# Patient Record
Sex: Female | Born: 1990 | Hispanic: No | Marital: Single | Smoking: Never smoker
Health system: Southern US, Community
[De-identification: ages and names within clinical notes are randomized; demographics above are authoritative.]

## PROBLEM LIST (undated history)

## (undated) DIAGNOSIS — F419 Anxiety disorder, unspecified: Secondary | ICD-10-CM

## (undated) DIAGNOSIS — Z789 Other specified health status: Secondary | ICD-10-CM

## (undated) HISTORY — PX: TYMPANOSTOMY TUBE PLACEMENT: SHX32

## (undated) HISTORY — DX: Other specified health status: Z78.9

---

## 2009-02-01 ENCOUNTER — Emergency Department (HOSPITAL_COMMUNITY): Admission: EM | Admit: 2009-02-01 | Discharge: 2009-02-01 | Payer: Self-pay | Admitting: Emergency Medicine

## 2009-10-09 IMAGING — CR DG CHEST 2V
2 series · 2 of 2 positions shown · non-contrast
Comparison: None

CLINICAL DATA: Coughing congestion

CHEST - 2 VIEW

[view not recorded (1 of 2)]
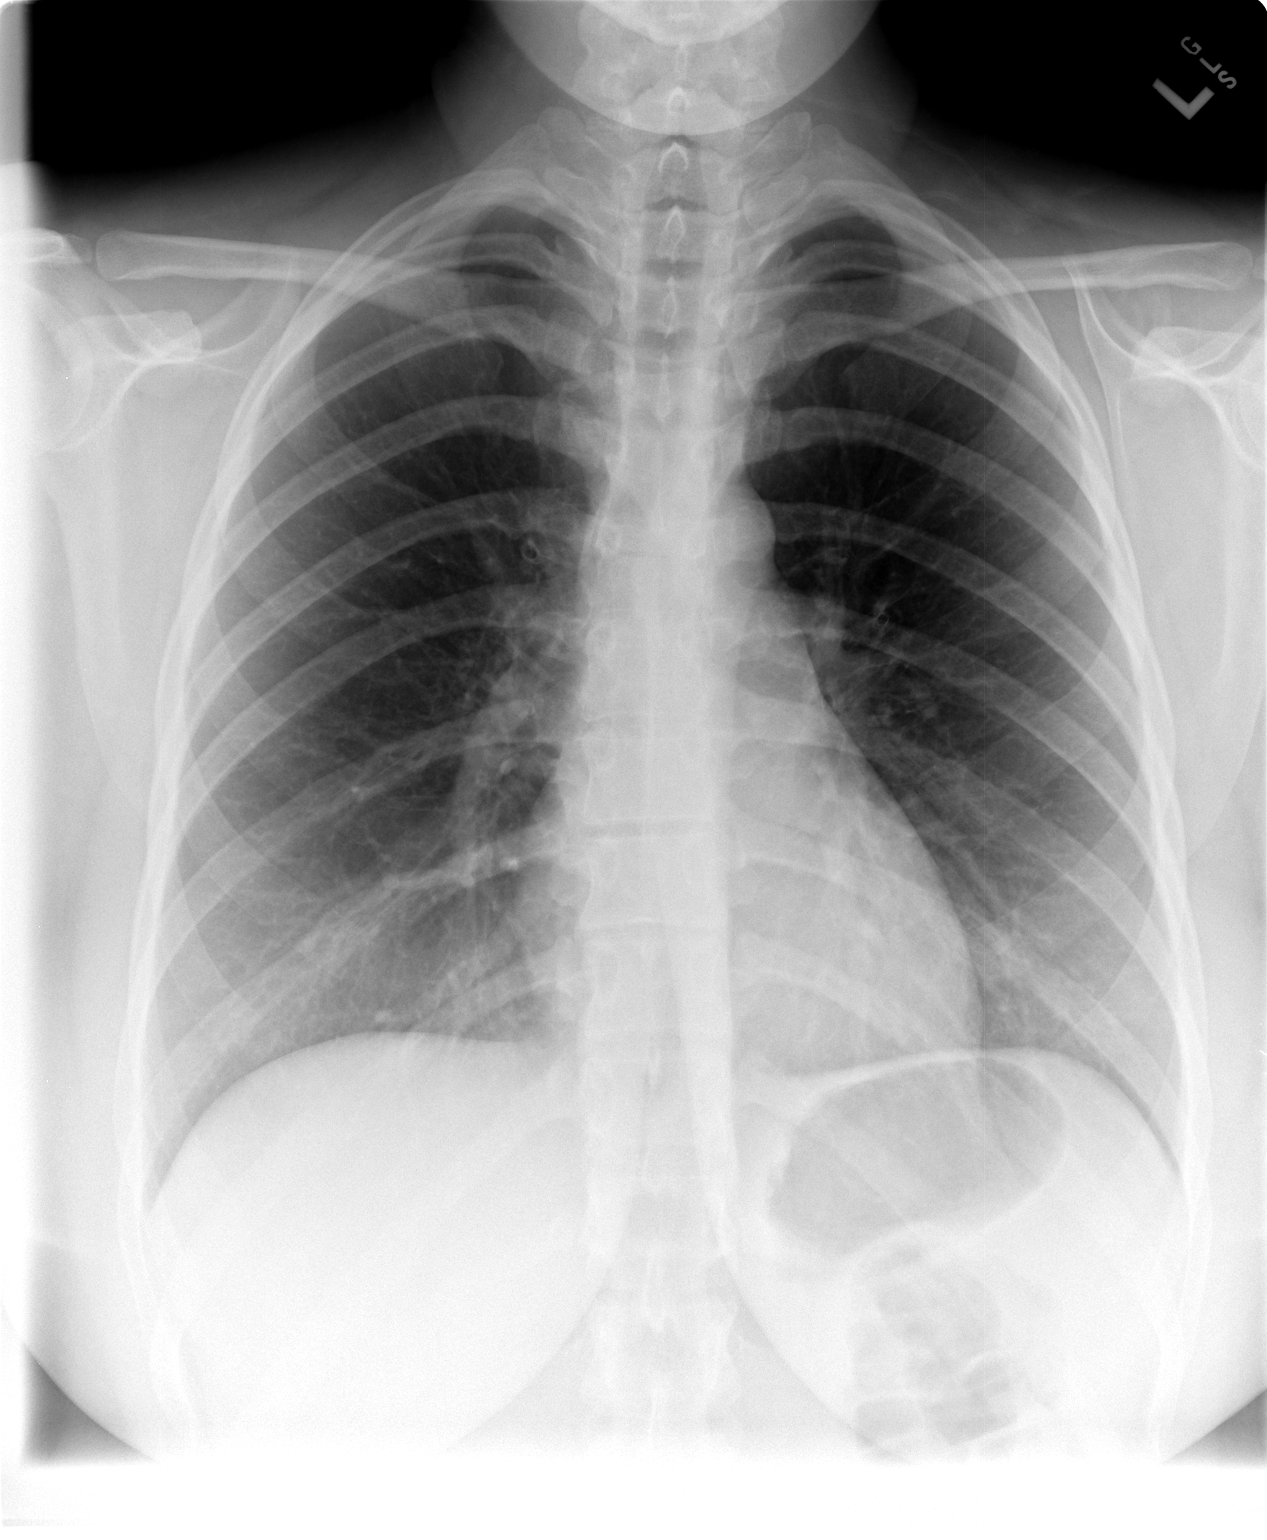

[view not recorded (2 of 2)]
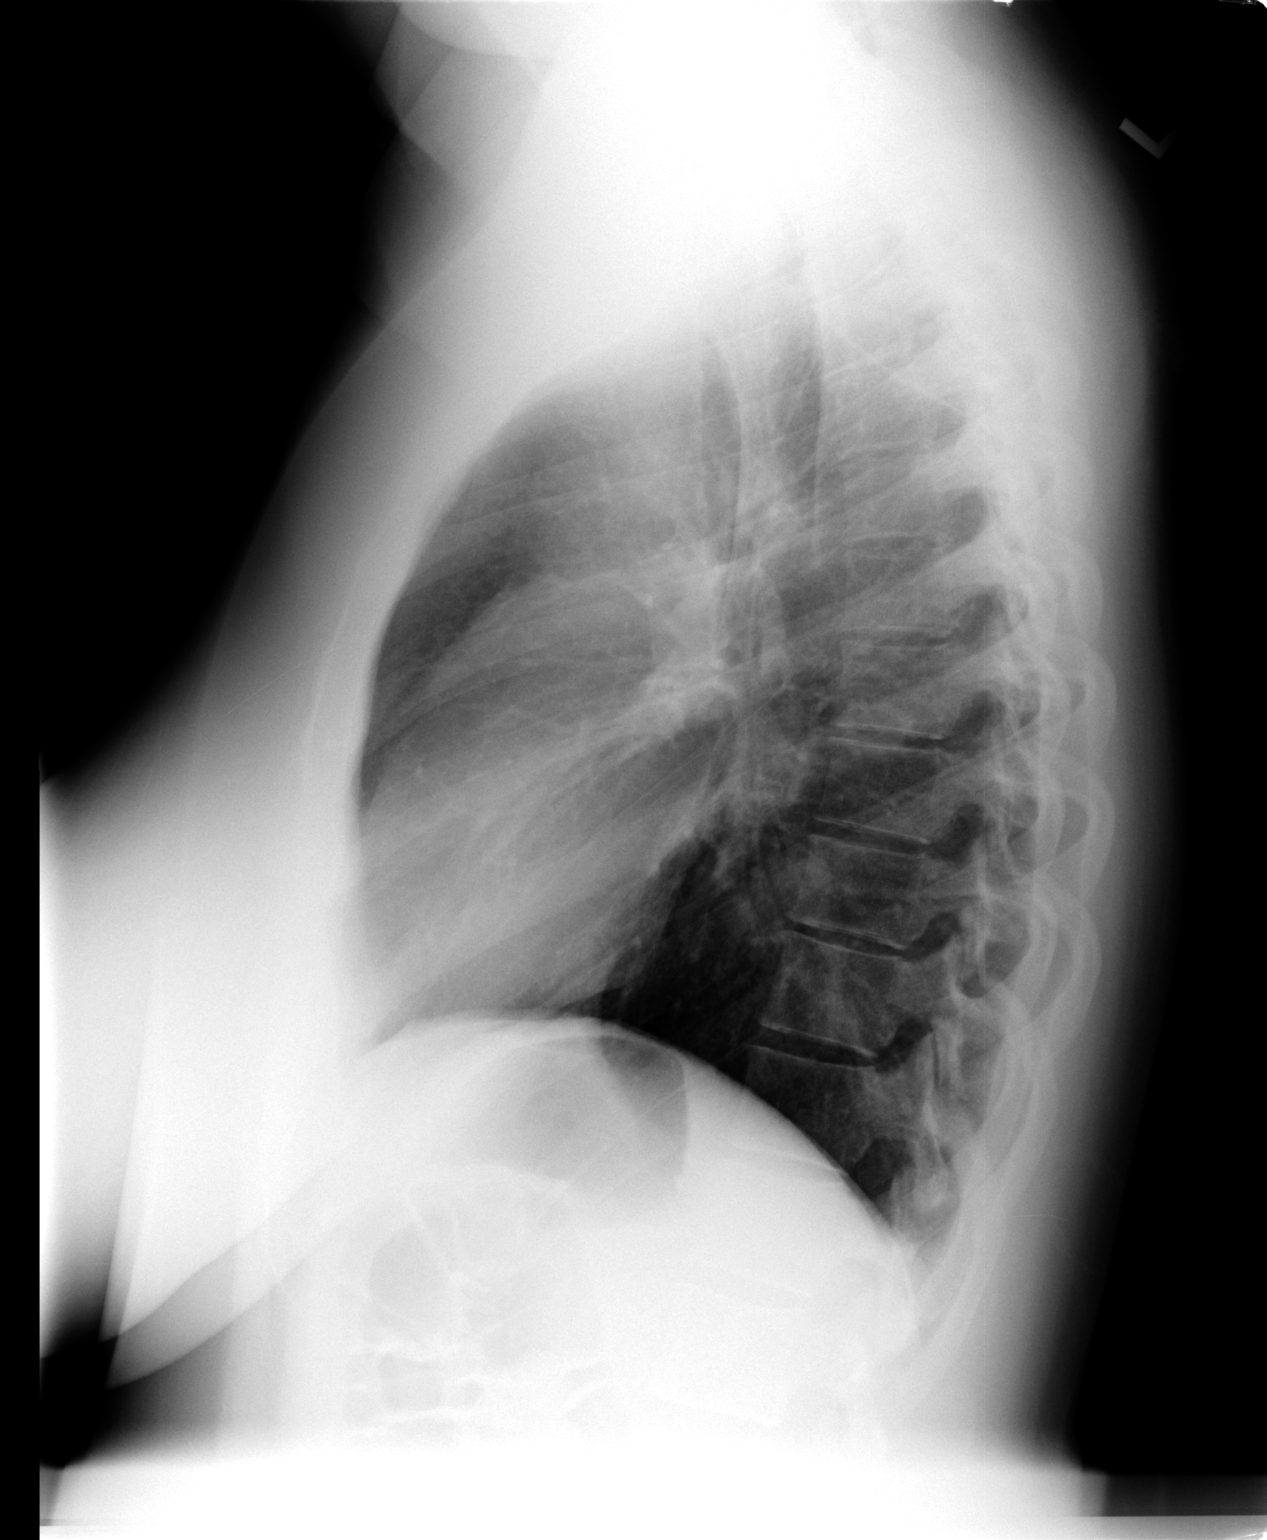

[2 of 2 positions shown; findings below may reference images not displayed]

FINDINGS: Normal mediastinum and cardiac silhouette.  Costophrenic
angles are clear.  No evidence effusion, infiltrate, or
pneumothorax.
IMPRESSION: No acute cardiopulmonary process.

## 2010-12-18 NOTE — L&D Delivery Note (Signed)
Delivery Note At 5:17 PM a healthy female was delivered via Vaginal, Spontaneous Delivery (Presentation: LIOA ).  APGAR: 8, 9; weight 8 lb 5.7 oz (3790 g).   Placenta status: Intact, Spontaneous. Moderate meconium, baby had good cry and color immediately following delivery.  Anesthesia: Epidural  Episiotomy: None Lacerations: 1st degree;Perineal Suture Repair: 3.0 vicryl rapide Est. Blood Loss (mL): 350cc  Mom to postpartum.  Baby to nursery-stable.  Oliver Pila 08/08/2011, 6:03 PM

## 2011-08-04 ENCOUNTER — Encounter (HOSPITAL_COMMUNITY): Payer: Self-pay | Admitting: *Deleted

## 2011-08-04 ENCOUNTER — Telehealth (HOSPITAL_COMMUNITY): Payer: Self-pay | Admitting: *Deleted

## 2011-08-04 NOTE — Telephone Encounter (Signed)
Preadmission screening 

## 2011-08-07 ENCOUNTER — Inpatient Hospital Stay (HOSPITAL_COMMUNITY)
Admission: RE | Admit: 2011-08-07 | Discharge: 2011-08-10 | DRG: 775 | Disposition: A | Discharge status: Home / Self Care | Payer: Medicaid Other | Source: Ambulatory Visit | Attending: Obstetrics and Gynecology | Admitting: Obstetrics and Gynecology

## 2011-08-07 ENCOUNTER — Encounter (HOSPITAL_COMMUNITY): Payer: Self-pay

## 2011-08-07 ENCOUNTER — Other Ambulatory Visit: Payer: Self-pay | Admitting: Obstetrics and Gynecology

## 2011-08-07 DIAGNOSIS — O480 Post-term pregnancy: Principal | ICD-10-CM | POA: Diagnosis present

## 2011-08-07 HISTORY — DX: Anxiety disorder, unspecified: F41.9

## 2011-08-07 LAB — CBC
HCT: 36.9 % (ref 36.0–46.0)
Hemoglobin: 12.3 g/dL (ref 12.0–15.0)
MCH: 26.4 pg (ref 26.0–34.0)
MCHC: 33.3 g/dL (ref 30.0–36.0)
MCV: 79.2 fL (ref 78.0–100.0)
Platelets: 263 10*3/uL (ref 150–400)
RBC: 4.66 MIL/uL (ref 3.87–5.11)
RDW: 15.4 % (ref 11.5–15.5)
WBC: 9.6 10*3/uL (ref 4.0–10.5)

## 2011-08-07 LAB — ABO/RH: RH Type: POSITIVE

## 2011-08-07 LAB — HEPATITIS B SURFACE ANTIGEN: Hepatitis B Surface Ag: NEGATIVE

## 2011-08-07 LAB — RPR: RPR: NONREACTIVE

## 2011-08-07 LAB — STREP B DNA PROBE: GBS: NEGATIVE

## 2011-08-07 LAB — RUBELLA ANTIBODY, IGM: Rubella: IMMUNE

## 2011-08-07 LAB — ANTIBODY SCREEN: Antibody Screen: NEGATIVE

## 2011-08-07 LAB — HIV ANTIBODY (ROUTINE TESTING W REFLEX): HIV: NONREACTIVE

## 2011-08-07 MED ORDER — OXYTOCIN BOLUS FROM INFUSION
500.0000 mL | Freq: Once | INTRAVENOUS | Status: DC
  Filled 2011-08-07: qty 500

## 2011-08-07 MED ORDER — LACTATED RINGERS IV SOLN
INTRAVENOUS | Status: DC
  Administered 2011-08-07 – 2011-08-08 (×4): via INTRAVENOUS

## 2011-08-07 MED ORDER — OXYCODONE-ACETAMINOPHEN 5-325 MG PO TABS
2.0000 | ORAL_TABLET | ORAL | Status: DC | PRN
  Filled 2011-08-07: qty 1

## 2011-08-07 MED ORDER — OXYTOCIN 20 UNITS IN LACTATED RINGERS INFUSION - SIMPLE
1.0000 m[IU]/min | INTRAVENOUS | Status: DC
  Administered 2011-08-08: 2 m[IU]/min via INTRAVENOUS
  Administered 2011-08-08: 333 m[IU]/min via INTRAVENOUS
  Filled 2011-08-07: qty 1000

## 2011-08-07 MED ORDER — ONDANSETRON HCL 4 MG/2ML IJ SOLN: 4.0000 mg | Freq: Four times a day (QID) | INTRAMUSCULAR | Status: DC | PRN

## 2011-08-07 MED ORDER — OXYTOCIN 20 UNITS IN LACTATED RINGERS INFUSION - SIMPLE: 125.0000 mL/h | INTRAVENOUS | Status: DC

## 2011-08-07 MED ORDER — ZOLPIDEM TARTRATE 10 MG PO TABS
10.0000 mg | ORAL_TABLET | Freq: Every evening | ORAL | Status: DC | PRN
  Administered 2011-08-07: 10 mg via ORAL
  Filled 2011-08-07: qty 1

## 2011-08-07 MED ORDER — IBUPROFEN 600 MG PO TABS: 600.0000 mg | ORAL_TABLET | Freq: Four times a day (QID) | ORAL | Status: DC | PRN

## 2011-08-07 MED ORDER — FLEET ENEMA 7-19 GM/118ML RE ENEM: 1.0000 | ENEMA | RECTAL | Status: DC | PRN

## 2011-08-07 MED ORDER — DINOPROSTONE 10 MG VA INST
10.0000 mg | VAGINAL_INSERT | Freq: Once | VAGINAL | Status: AC
  Administered 2011-08-07: 10 mg via VAGINAL
  Filled 2011-08-07: qty 1

## 2011-08-07 MED ORDER — LACTATED RINGERS IV SOLN: 500.0000 mL | INTRAVENOUS | Status: DC | PRN

## 2011-08-07 MED ORDER — CITRIC ACID-SODIUM CITRATE 334-500 MG/5ML PO SOLN: 30.0000 mL | ORAL | Status: DC | PRN

## 2011-08-07 MED ORDER — ACETAMINOPHEN 325 MG PO TABS: 650.0000 mg | ORAL_TABLET | ORAL | Status: DC | PRN

## 2011-08-07 MED ORDER — TERBUTALINE SULFATE 1 MG/ML IJ SOLN: 0.2500 mg | Freq: Once | INTRAMUSCULAR | Status: AC | PRN

## 2011-08-07 MED ORDER — LIDOCAINE HCL (PF) 1 % IJ SOLN
30.0000 mL | INTRAMUSCULAR | Status: DC | PRN
  Filled 2011-08-07: qty 30

## 2011-08-07 NOTE — H&P (Signed)
Heather Carlson is an 20 y.o. female G1P0 at 68 1/7weeks (EDD 07/30/11 by 14 week [place])  Presents for ripening and induction given postterm status.  Prenatal care has been uneventful.    Pertinent Gynecological History: none  OB History: none   Menstrual History:  Patient's last menstrual period was 10/16/2010.    Past Medical History  Diagnosis Date  . No pertinent past medical history   . Complication of anesthesia     states she is [place]umb[place] and has been told her family has had problems in the past wioth "over heating with general anesthesia."    Past Surgical History  Procedure Date  . Tympanostomy tube placement     Family History  Problem Relation Age of Onset  . Alcohol abuse Father   . Heart disease Father   . Hypertension Father   . Cancer Maternal Grandmother   . Alcohol abuse Maternal Grandfather   . Heart disease Maternal Grandfather   . Alcohol abuse Paternal Grandfather   . Diabetes Paternal Grandfather   . Heart disease Paternal Grandfather   . Hypertension Paternal Grandfather     Social History:  reports that she has never smoked. She does not have any smokeless tobacco history on file. She reports that she does not drink alcohol or use illicit drugs.  Allergies:  Allergies  Allergen Reactions  . Codeine Rash    Prenatals A positive, Ab negative, Rub I, VDRL NR, GC neg, Chlam neg, HIV neg, Declined genetics, One hour glucola 81, GBS neg   ROS  Last menstrual period 10/16/2010. Physical Exam  Constitutional: She appears well-developed.  Cardiovascular: Normal rate and regular rhythm.   Respiratory: Effort normal and breath sounds normal.  GI: Soft. Bowel sounds are normal.  Genitourinary: Vagina normal and uterus normal.  Neurological: She is alert.  Psychiatric: She has a normal mood and affect. Her behavior is normal.    Cervix 50/FT/-2 posterior   Assessment/Plan: Pt for ripening and induction given post term pregnancy.  No real  prenatal issues.    Oliver Pila 08/07/2011, 5:54 PM

## 2011-08-07 NOTE — Plan of Care (Signed)
Problem: Consults Goal: Birthing Suites Patient Information Press F2 to bring up selections list Outcome: Completed/Met Date Met:  08/07/11  Pt > [redacted] weeks EGA and Inpatient induction

## 2011-08-08 ENCOUNTER — Encounter (HOSPITAL_COMMUNITY): Payer: Self-pay | Admitting: Anesthesiology

## 2011-08-08 ENCOUNTER — Inpatient Hospital Stay (HOSPITAL_COMMUNITY): Admission: RE | Admit: 2011-08-08 | Payer: Self-pay | Source: Ambulatory Visit

## 2011-08-08 ENCOUNTER — Inpatient Hospital Stay (HOSPITAL_COMMUNITY): Payer: Medicaid Other | Admitting: Anesthesiology

## 2011-08-08 ENCOUNTER — Encounter (HOSPITAL_COMMUNITY): Payer: Self-pay

## 2011-08-08 LAB — RPR: RPR Ser Ql: NONREACTIVE

## 2011-08-08 MED ORDER — LANOLIN HYDROUS EX OINT: TOPICAL_OINTMENT | CUTANEOUS | Status: DC | PRN

## 2011-08-08 MED ORDER — OXYCODONE-ACETAMINOPHEN 5-325 MG PO TABS: 1.0000 | ORAL_TABLET | ORAL | Status: DC | PRN

## 2011-08-08 MED ORDER — PRENATAL PLUS 27-1 MG PO TABS
1.0000 | ORAL_TABLET | Freq: Every day | ORAL | Status: DC
  Administered 2011-08-09 – 2011-08-10 (×2): 1 via ORAL
  Filled 2011-08-08 (×2): qty 1

## 2011-08-08 MED ORDER — DIBUCAINE 1 % RE OINT
1.0000 "application " | TOPICAL_OINTMENT | RECTAL | Status: DC | PRN
  Filled 2011-08-08: qty 28

## 2011-08-08 MED ORDER — BENZOCAINE-MENTHOL 20-0.5 % EX AERO
INHALATION_SPRAY | CUTANEOUS | Status: AC
  Filled 2011-08-08: qty 56

## 2011-08-08 MED ORDER — TETANUS-DIPHTH-ACELL PERTUSSIS 5-2.5-18.5 LF-MCG/0.5 IM SUSP: 0.5000 mL | Freq: Once | INTRAMUSCULAR | Status: DC

## 2011-08-08 MED ORDER — SIMETHICONE 80 MG PO CHEW: 80.0000 mg | CHEWABLE_TABLET | ORAL | Status: DC | PRN

## 2011-08-08 MED ORDER — DIPHENHYDRAMINE HCL 25 MG PO CAPS: 25.0000 mg | ORAL_CAPSULE | Freq: Four times a day (QID) | ORAL | Status: DC | PRN

## 2011-08-08 MED ORDER — ONDANSETRON HCL 4 MG/2ML IJ SOLN: 4.0000 mg | INTRAMUSCULAR | Status: DC | PRN

## 2011-08-08 MED ORDER — PHENYLEPHRINE 40 MCG/ML (10ML) SYRINGE FOR IV PUSH (FOR BLOOD PRESSURE SUPPORT): 80.0000 ug | PREFILLED_SYRINGE | INTRAVENOUS | Status: DC | PRN

## 2011-08-08 MED ORDER — EPHEDRINE 5 MG/ML INJ
10.0000 mg | INTRAVENOUS | Status: DC | PRN
  Filled 2011-08-08: qty 4

## 2011-08-08 MED ORDER — EPHEDRINE 5 MG/ML INJ: 10.0000 mg | INTRAVENOUS | Status: DC | PRN

## 2011-08-08 MED ORDER — IBUPROFEN 600 MG PO TABS
600.0000 mg | ORAL_TABLET | Freq: Four times a day (QID) | ORAL | Status: DC
  Administered 2011-08-08 – 2011-08-10 (×6): 600 mg via ORAL
  Filled 2011-08-08 (×6): qty 1

## 2011-08-08 MED ORDER — BUTORPHANOL TARTRATE 2 MG/ML IJ SOLN
INTRAMUSCULAR | Status: AC
  Administered 2011-08-08: 2 mg
  Filled 2011-08-08: qty 1

## 2011-08-08 MED ORDER — ZOLPIDEM TARTRATE 5 MG PO TABS: 5.0000 mg | ORAL_TABLET | Freq: Every evening | ORAL | Status: DC | PRN

## 2011-08-08 MED ORDER — BUTORPHANOL TARTRATE 2 MG/ML IJ SOLN: 1.0000 mg | Freq: Once | INTRAMUSCULAR | Status: DC

## 2011-08-08 MED ORDER — PHENYLEPHRINE 40 MCG/ML (10ML) SYRINGE FOR IV PUSH (FOR BLOOD PRESSURE SUPPORT)
80.0000 ug | PREFILLED_SYRINGE | INTRAVENOUS | Status: DC | PRN
  Filled 2011-08-08: qty 5

## 2011-08-08 MED ORDER — FENTANYL 2.5 MCG/ML BUPIVACAINE 1/10 % EPIDURAL INFUSION (WH - ANES)
14.0000 mL/h | INTRAMUSCULAR | Status: DC
  Administered 2011-08-08 (×2): 14 mL/h via EPIDURAL
  Filled 2011-08-08 (×2): qty 60

## 2011-08-08 MED ORDER — LACTATED RINGERS IV SOLN: 500.0000 mL | Freq: Once | INTRAVENOUS | Status: DC

## 2011-08-08 MED ORDER — DIPHENHYDRAMINE HCL 50 MG/ML IJ SOLN: 12.5000 mg | INTRAMUSCULAR | Status: DC | PRN

## 2011-08-08 MED ORDER — SENNOSIDES-DOCUSATE SODIUM 8.6-50 MG PO TABS
2.0000 | ORAL_TABLET | Freq: Every day | ORAL | Status: DC
  Administered 2011-08-08: 2 via ORAL
  Administered 2011-08-09: 1 via ORAL

## 2011-08-08 MED ORDER — ONDANSETRON HCL 4 MG PO TABS: 4.0000 mg | ORAL_TABLET | ORAL | Status: DC | PRN

## 2011-08-08 MED ORDER — WITCH HAZEL-GLYCERIN EX PADS: 1.0000 "application " | MEDICATED_PAD | CUTANEOUS | Status: DC | PRN

## 2011-08-08 MED ORDER — BENZOCAINE-MENTHOL 20-0.5 % EX AERO: 1.0000 "application " | INHALATION_SPRAY | CUTANEOUS | Status: DC | PRN

## 2011-08-08 NOTE — Anesthesia Preprocedure Evaluation (Addendum)
Anesthesia Evaluation  Name, MR# and DOB Patient awake  General Assessment Comment  Reviewed: Allergy & Precautions, H&P , Patient's Chart, lab work & pertinent test results  Airway Mallampati: II TM Distance: >3 FB Neck ROM: full    Dental  (+) Teeth Intact   Pulmonary  clear to auscultation  breath sounds clear to auscultation none    Cardiovascular regular Normal    Neuro/Psych   GI/Hepatic/Renal   Endo/Other    Abdominal   Musculoskeletal   Hematology   Peds  Reproductive/Obstetrics (+) Pregnancy    Anesthesia Other Findings                 Anesthesia Physical Anesthesia Plan  ASA: II  Anesthesia Plan: Epidural   Post-op Pain Management:    Induction:   Airway Management Planned:   Additional Equipment:   Intra-op Plan:   Post-operative Plan:   Informed Consent:   Plan Discussed with:   Anesthesia Plan Comments:         Anesthesia Quick Evaluation  

## 2011-08-08 NOTE — Anesthesia Procedure Notes (Addendum)
Epidural Patient location during procedure: OB  Staffing Anesthesiologist: Blaze Sandin EDWARD  Preanesthetic Checklist Completed: patient identified, site marked, surgical consent, pre-op evaluation, timeout performed, IV checked, risks and benefits discussed and monitors and equipment checked  Epidural Patient position: sitting Prep: site prepped and draped and DuraPrep Patient monitoring: continuous pulse ox and blood pressure Approach: midline Injection technique: LOR air  Needle:  Needle type: Tuohy  Needle gauge: 17 G Needle length: 9 cm Needle insertion depth: 6 cm Catheter type: closed end flexible Catheter size: 19 Gauge Catheter at skin depth: 12 cm Test dose: negative  Assessment Events: blood not aspirated, injection not painful, no injection resistance, negative IV test and no paresthesia  Additional Notes Dosing of Epidural: 1st dose, Through needle...... 5mg Marcaine 2nd dose, through catheter.... epi 1:200K + Xylocaine 40 mg 3rd dose, through catheter.....epi 1:200K + Xylocaine 60 mg Each dose occurred after waiting 3 min,patient was free of IV sx; and patient exhibits no evidence of SA injection  Patient is more comfortable after epidural dosed. Please see RN's note for documentation of vital signs,and FHR which are stable.    

## 2011-08-08 NOTE — Progress Notes (Signed)
Dr Senaida Ores called to check on pt.  Nofified MD that cervidil is still in at this time and pitocin had not been started at 0600.  Notified pt is hesitant for pitocin.  Agreed for RN to remove cervidil and perform SVE.  Will start pitocin if no significant cervical change

## 2011-08-08 NOTE — Progress Notes (Signed)
Heather Carlson is a 20 y.o. G1P0000 at [redacted]w[redacted]d for induction given postterm.  Pt admitted and received cervidil ripening last pm.  Is very anxious regarding labor.  Subjective: Anxious, states feels ctx and are painful but coping with them.  Objective: BP 133/77  Pulse 91  Temp(Src) 97.6 F (36.4 C) (Oral)  Resp 18  Ht 5\' 6"  (1.676 m)  Wt 83.008 kg (183 lb)  BMI 29.54 kg/m2  LMP 10/16/2010      FHT:  FHR: 140 bpm, variability: moderate,  accelerations:  Present,  decelerations:  Absent UC:   regular, every 4 minutes SVE:   Dilation: 2 Effacement (%): 70 Station: -2 Exam by:: Rewa Weissberg AROM moderate meconium  Labs: Lab Results  Component Value Date   WBC 9.6 08/07/2011   HGB 12.3 08/07/2011   HCT 36.9 08/07/2011   MCV 79.2 08/07/2011   PLT 263 08/07/2011    Assessment / Plan: Induction of labor due to postterm,  progressing well on pitocin Will get stadol for pain   Heather Carlson W 08/08/2011, 10:38 AM

## 2011-08-08 NOTE — Progress Notes (Signed)
   Subjective: Comfortable with epidural  Objective: BP 169/82  Pulse 84  Temp(Src) 98.3 F (36.8 C) (Axillary)  Resp 16  Ht 5\' 6"  (1.676 m)  Wt 83.008 kg (183 lb)  BMI 29.54 kg/m2  LMP 10/16/2010      FHT:  FHR: 135 bpm, variability: moderate,  accelerations:  Present,  decelerations:  Present mild decels with contractions, no lates UC:   regular, every 1-2 minutes SVE:   Dilation: 8.5 Effacement (%): 100 Station: 0 Exam by:: Dr Olivia Canter OP presentation  Labs: Lab Results  Component Value Date   WBC 9.6 08/07/2011   HGB 12.3 08/07/2011   HCT 36.9 08/07/2011   MCV 79.2 08/07/2011   PLT 263 08/07/2011    Assessment / Plan: Induction of labor due to postterm,  progressing well on pitocin  Heather Carlson W 08/08/2011, 2:05 PM

## 2011-08-09 LAB — CBC
HCT: 30.8 % — ABNORMAL LOW (ref 36.0–46.0)
Hemoglobin: 10 g/dL — ABNORMAL LOW (ref 12.0–15.0)
MCH: 26.4 pg (ref 26.0–34.0)
MCHC: 32.5 g/dL (ref 30.0–36.0)
MCV: 81.3 fL (ref 78.0–100.0)
Platelets: 188 10*3/uL (ref 150–400)
RBC: 3.79 MIL/uL — ABNORMAL LOW (ref 3.87–5.11)
RDW: 15 % (ref 11.5–15.5)
WBC: 12.6 10*3/uL — ABNORMAL HIGH (ref 4.0–10.5)

## 2011-08-09 NOTE — Progress Notes (Signed)
Post Partum Day1 Subjective: no complaints and voiding  Objective: Blood pressure 119/67, pulse 60, temperature 97.9 F (36.6 C), temperature source Oral, resp. rate 18, height 5\' 6"  (1.676 m), weight 83.008 kg (183 lb), last menstrual period 10/16/2010, unknown if currently breastfeeding.  Physical Exam:  General: alert Lochia: appropriate Uterine Fundus: firm  Basename 08/09/11 0637 08/07/11 2127  HGB 10.0* 12.3  HCT 30.8* 36.9    Assessment/Plan: Plan for discharge tomorrow Plans circumcision in office   LOS: 2 days   Aidyn Sportsman W 08/09/2011, 9:13 AM

## 2011-08-09 NOTE — Progress Notes (Signed)

## 2011-08-09 NOTE — Progress Notes (Signed)
UR chart review completed.  

## 2011-08-09 NOTE — Anesthesia Postprocedure Evaluation (Signed)
  Anesthesia Post-op Note  Patient: Heather Carlson  Procedure(s) Performed: * No procedures listed *  Patient Location: [facility] Unit  Anesthesia Type: Epidural  Level of Consciousness: awake, alert  and oriented  Airway and Oxygen Therapy: Patient Spontanous Breathing  Post-op Pain: none  Post-op Assessment: Post-op Vital signs reviewed  Post-op Vital Signs: Reviewed and stable  Complications: No apparent anesthesia complications

## 2011-08-10 MED ORDER — IBUPROFEN 600 MG PO TABS: 600.0000 mg | ORAL_TABLET | Freq: Four times a day (QID) | ORAL | Status: AC

## 2011-08-10 MED ORDER — OXYCODONE-ACETAMINOPHEN 5-325 MG PO TABS: 1.0000 | ORAL_TABLET | ORAL | Status: AC | PRN

## 2011-08-10 NOTE — Progress Notes (Signed)
Post Partum Day  2 Subjective: no complaints  Objective: Blood pressure 117/71, pulse 60, temperature 97.5 F (36.4 C), temperature source Oral, resp. rate 18, height 5\' 6"  (1.676 m), weight 83.008 kg (183 lb), last menstrual period 10/16/2010, unknown if currently breastfeeding.  Physical Exam:  General: alert Lochia: appropriate Uterine Fundus: firm   Basename 08/09/11 0637 08/07/11 2127  HGB 10.0* 12.3  HCT 30.8* 36.9    Assessment/Plan: Discharge home Motrin Percocet   LOS: 3 days   Iyannah Blake W 08/10/2011, 8:59 AM

## 2011-08-10 NOTE — Discharge Summary (Signed)
Obstetric Discharge Summary Reason for Admission: induction of labor Prenatal Procedures: none Intrapartum Procedures: spontaneous vaginal delivery Postpartum Procedures: none Complications-Operative and Postpartum: 1st degree perineal laceration Hemoglobin  Date Value Range Status  08/09/2011 10.0* 12.0-15.0 (g/dL) Final     DELTA CHECK NOTED     REPEATED TO VERIFY     HCT  Date Value Range Status  08/09/2011 30.8* 36.0-46.0 (%) Final    Discharge Diagnoses: Term Pregnancy-delivered                                         S/p NSVD  Discharge Information: Date: 08/10/2011 Activity: pelvic rest Diet: routine Medications: Ibuprophen and Percocet Condition: stable Instructions: refer to practice specific booklet Discharge to: home Follow-up Information    Follow up with Heather Carlson W. Make an appointment in 6 weeks.   Contact information:   [address] [place] [address] [phone]          Newborn Data: Live born female  Birth Weight: 8 lb 5.7 oz (3790 g) APGAR: 8, 9  Home with mother.  Heather Carlson W 08/10/2011, 9:00 AM

## 2011-08-12 NOTE — H&P (Signed)
  08/07/11  Chart Review, notes, H&P JBOVARD, MD

## 2011-11-27 NOTE — Telephone Encounter (Signed)
Preadmission screen  

## 2014-10-19 ENCOUNTER — Encounter (HOSPITAL_COMMUNITY): Payer: Self-pay

## 2020-02-12 ENCOUNTER — Ambulatory Visit: Payer: 59 | Attending: Internal Medicine

## 2020-02-13 ENCOUNTER — Ambulatory Visit: Payer: 59 | Attending: Internal Medicine

## 2020-02-13 DIAGNOSIS — Z20822 Contact with and (suspected) exposure to covid-19: Secondary | ICD-10-CM

## 2020-02-14 LAB — NOVEL CORONAVIRUS, NAA: SARS-CoV-2, NAA: NOT DETECTED

## 2020-06-09 ENCOUNTER — Ambulatory Visit: Payer: 59 | Attending: Internal Medicine

## 2020-06-09 DIAGNOSIS — Z20822 Contact with and (suspected) exposure to covid-19: Secondary | ICD-10-CM

## 2020-06-10 LAB — SARS-COV-2, NAA 2 DAY TAT

## 2020-06-10 LAB — NOVEL CORONAVIRUS, NAA: SARS-CoV-2, NAA: NOT DETECTED

## 2021-05-10 ENCOUNTER — Other Ambulatory Visit: Payer: Self-pay

## 2021-05-10 ENCOUNTER — Ambulatory Visit
Admission: EM | Admit: 2021-05-10 | Discharge: 2021-05-10 | Disposition: A | Discharge status: Home / Self Care | Payer: BLUE CROSS/BLUE SHIELD | Attending: Family Medicine | Admitting: Family Medicine

## 2021-05-10 ENCOUNTER — Encounter: Payer: Self-pay | Admitting: Emergency Medicine

## 2021-05-10 DIAGNOSIS — R350 Frequency of micturition: Secondary | ICD-10-CM | POA: Diagnosis not present

## 2021-05-10 DIAGNOSIS — R300 Dysuria: Secondary | ICD-10-CM

## 2021-05-10 DIAGNOSIS — R8290 Unspecified abnormal findings in urine: Secondary | ICD-10-CM | POA: Diagnosis not present

## 2021-05-10 LAB — URINALYSIS, COMPLETE (UACMP) WITH MICROSCOPIC
Bilirubin Urine: NEGATIVE
Glucose, UA: NEGATIVE mg/dL
Hgb urine dipstick: NEGATIVE
Ketones, ur: NEGATIVE mg/dL
Leukocytes,Ua: NEGATIVE
Nitrite: NEGATIVE
Protein, ur: NEGATIVE mg/dL
Specific Gravity, Urine: 1.015 (ref 1.005–1.030)
pH: 5 (ref 5.0–8.0)

## 2021-05-10 MED ORDER — CEPHALEXIN 500 MG PO CAPS: 500.0000 mg | ORAL_CAPSULE | Freq: Two times a day (BID) | ORAL | 0 refills | Status: AC

## 2021-05-10 NOTE — ED Triage Notes (Signed)
Pt c/o dysuria, urinary urgency. she was given nitrofurantion on 04/06/21. She states she left it in the sun and it melted.

## 2021-05-10 NOTE — ED Provider Notes (Signed)
[facility]    CSN: [id number] Arrival date & time: 05/10/21  1904      History   Chief Complaint Chief Complaint  Patient presents with  . Urinary Urgency    HPI Heather Carlson is a 30 y.o. female presenting for several week history of urinary frequency, urgency, foul-smelling urine and mild dysuria.  Patient says she was diagnosed with a UTI last month and took about 2 or 3 days of the Macrobid she was prescribed because she left the medication outside in the hot sun and the capsules melted.  Patient says that some of her symptoms did improve but they are not entirely resolved.  She denies any fever, fatigue, abdominal pain, back pain, hematuria, abnormal vaginal discharge.  Has not taken any OTC meds.  States she has a history of recurrent UTIs.  No other concerns.  HPI  Past Medical History:  Diagnosis Date  . Anxiety Needle anxiety  . No pertinent past medical history     There are no problems to display for this patient.   Past Surgical History:  Procedure Laterality Date  . TYMPANOSTOMY TUBE PLACEMENT      OB History    Gravida  1   Para  1   Term  1   Preterm  0   AB  0   Living  1     SAB  0   IAB  0   Ectopic  0   Multiple  0   Live Births  1            Home Medications    Prior to Admission medications   Medication Sig Start Date End Date Taking? Authorizing Provider  cephALEXin (KEFLEX) 500 MG capsule Take 1 capsule (500 mg total) by mouth 2 (two) times daily for 7 days. 05/10/21 05/17/21 Yes Shirlee Latch, PA-C  medroxyPROGESTERone (PROVERA) 5 MG tablet medroxyprogesterone 5 mg tablet  TAKE ONE TABLET DAYS 1-15 OF EVERY MONTH.    [provider]  metFORMIN (GLUCOPHAGE) 500 MG tablet metformin 500 mg tablet  TAKE 1 TABLET BY MOUTH EVERY DAY FOR 30 DAYS    [provider]    Family History Family History  Problem Relation Age of Onset  . Alcohol abuse Father   . Heart disease Father   .  Hypertension Father   . Cancer Maternal Grandmother   . Alcohol abuse Maternal Grandfather   . Heart disease Maternal Grandfather   . Alcohol abuse Paternal Grandfather   . Diabetes Paternal Grandfather   . Heart disease Paternal Grandfather   . Hypertension Paternal Grandfather     Social History Social History   Tobacco Use  . Smoking status: Never Smoker  . Smokeless tobacco: Never Used  Vaping Use  . Vaping Use: Never used  Substance Use Topics  . Alcohol use: No  . Drug use: No     Allergies   Codeine and Latex   Review of Systems Review of Systems  Constitutional: Negative for chills and fever.  Gastrointestinal: Negative for abdominal pain, diarrhea, nausea and vomiting.  Genitourinary: Positive for dysuria, frequency and urgency. Negative for decreased urine volume, flank pain, hematuria, pelvic pain, vaginal bleeding, vaginal discharge and vaginal pain.  Musculoskeletal: Negative for back pain.  Skin: Negative for rash.     Physical Exam Triage Vital Signs ED Triage Vitals  Enc Vitals Group     BP --      Pulse --  Resp --      Temp --      Temp src --      SpO2 --      Weight 05/10/21 1936 182 lb 15.7 oz (83 kg)     Height 05/10/21 1936 5\' 6"  (1.676 m)     Head Circumference --      Peak Flow --      Pain Score 05/10/21 1935 0     Pain Loc --      Pain Edu? --      Excl. in GC? --    No data found.  Updated Vital Signs BP (!) 142/100 (BP Location: Left Arm)   Pulse 71   Temp 98.7 F (37.1 C) (Oral)   Resp 18   Ht 5\' 6"  (1.676 m)   Wt 182 lb 15.7 oz (83 kg)   LMP 04/19/2021 (Approximate)   SpO2 99%   Breastfeeding No   BMI 29.53 kg/m       Physical Exam Vitals and nursing note reviewed.  Constitutional:      General: She is not in acute distress.    Appearance: Normal appearance. She is not ill-appearing or toxic-appearing.  HENT:     Head: Normocephalic and atraumatic.  Eyes:     General: No scleral icterus.       Right  eye: No discharge.        Left eye: No discharge.     Conjunctiva/sclera: Conjunctivae normal.  Cardiovascular:     Rate and Rhythm: Normal rate and regular rhythm.     Heart sounds: Normal heart sounds.  Pulmonary:     Effort: Pulmonary effort is normal. No respiratory distress.     Breath sounds: Normal breath sounds.  Abdominal:     Palpations: Abdomen is soft.     Tenderness: There is no abdominal tenderness. There is no right CVA tenderness or left CVA tenderness.  Musculoskeletal:     Cervical back: Neck supple.  Skin:    General: Skin is dry.  Neurological:     General: No focal deficit present.     Mental Status: She is alert. Mental status is at baseline.     Motor: No weakness.     Gait: Gait normal.  Psychiatric:        Mood and Affect: Mood normal.        Behavior: Behavior normal.        Thought Content: Thought content normal.      UC Treatments / Results  Labs (all labs ordered are listed, but only abnormal results are displayed) Labs Reviewed  URINALYSIS, COMPLETE (UACMP) WITH MICROSCOPIC - Abnormal; Notable for the following components:      Result Value   Bacteria, UA RARE (*)    All other components within normal limits  URINE CULTURE    EKG   Radiology No results found.  Procedures Procedures (including critical care time)  Medications Ordered in UC Medications - No data to display  Initial Impression / Assessment and Plan / UC Course  I have reviewed the triage vital signs and the nursing notes.  Pertinent labs & imaging results that were available during my care of the patient were reviewed by me and considered in my medical decision making (see chart for details).   30 year old female presenting for continued urinary frequency/urgency, dysuria, and foul-smelling urine.  I was able to review her urgent care note from April 2022 which did show a urinary tract infection.  Urinalysis  today does not really consistent with UTI.  No nitrites,  leukocytes, hemoglobin.  Reviewed this with patient.  We will send urine for culture.  I gave patient 2 options.  One option being wait for the results of the urine culture before treating and the second option being for me to go ahead and send antibiotic for her to take in case symptoms worsen before the urine culture returns.  Patient elected for me to go ahead and send the antibiotics I am sending Keflex.  Encouraged her to increase rest and fluids.  Follow-up with [place] as needed.   Final Clinical Impressions(s) / UC Diagnoses   Final diagnoses:  Urinary frequency  Abnormal urine odor  Dysuria     Discharge Instructions     The urinalysis is not necessarily consistent with UTI.  I will send the urine for culture.  I have sent Keflex in case her symptoms worsen or the urine culture shows positive since you did have a UTI last month and were unable to complete your course of antibiotics.  Increase rest and fluids.    ED Prescriptions    Medication Sig Dispense Auth. Provider   cephALEXin (KEFLEX) 500 MG capsule Take 1 capsule (500 mg total) by mouth 2 (two) times daily for 7 days. 14 capsule Shirlee Latch, PA-C     PDMP not reviewed this encounter.   Shirlee Latch, PA-C 05/10/21 2034

## 2021-05-10 NOTE — Discharge Instructions (Signed)
The urinalysis is not necessarily consistent with UTI.  I will send the urine for culture.  I have sent Keflex in case her symptoms worsen or the urine culture shows positive since you did have a UTI last month and were unable to complete your course of antibiotics.  Increase rest and fluids.

## 2021-05-13 LAB — URINE CULTURE: Culture: 3000 — AB

## 2021-12-30 DIAGNOSIS — N939 Abnormal uterine and vaginal bleeding, unspecified: Secondary | ICD-10-CM | POA: Diagnosis not present

## 2022-01-18 DIAGNOSIS — N939 Abnormal uterine and vaginal bleeding, unspecified: Secondary | ICD-10-CM | POA: Diagnosis not present

## 2022-01-18 DIAGNOSIS — N840 Polyp of corpus uteri: Secondary | ICD-10-CM | POA: Diagnosis not present

## 2022-07-06 DIAGNOSIS — Z130 Encounter for screening for diseases of the blood and blood-forming organs and certain disorders involving the immune mechanism: Secondary | ICD-10-CM | POA: Diagnosis not present

## 2022-07-06 DIAGNOSIS — N840 Polyp of corpus uteri: Secondary | ICD-10-CM | POA: Diagnosis not present

## 2022-07-06 DIAGNOSIS — Z13220 Encounter for screening for lipoid disorders: Secondary | ICD-10-CM | POA: Diagnosis not present

## 2022-07-06 DIAGNOSIS — E282 Polycystic ovarian syndrome: Secondary | ICD-10-CM | POA: Diagnosis not present

## 2022-07-06 DIAGNOSIS — Z01419 Encounter for gynecological examination (general) (routine) without abnormal findings: Secondary | ICD-10-CM | POA: Diagnosis not present

## 2022-07-06 DIAGNOSIS — Z1389 Encounter for screening for other disorder: Secondary | ICD-10-CM | POA: Diagnosis not present

## 2022-07-06 DIAGNOSIS — Z13228 Encounter for screening for other metabolic disorders: Secondary | ICD-10-CM | POA: Diagnosis not present

## 2022-08-15 DIAGNOSIS — Z30430 Encounter for insertion of intrauterine contraceptive device: Secondary | ICD-10-CM | POA: Diagnosis not present

## 2022-08-15 DIAGNOSIS — N926 Irregular menstruation, unspecified: Secondary | ICD-10-CM | POA: Diagnosis not present

## 2022-08-15 DIAGNOSIS — N840 Polyp of corpus uteri: Secondary | ICD-10-CM | POA: Diagnosis not present

## 2022-09-26 DIAGNOSIS — L739 Follicular disorder, unspecified: Secondary | ICD-10-CM | POA: Diagnosis not present

## 2022-09-26 DIAGNOSIS — D229 Melanocytic nevi, unspecified: Secondary | ICD-10-CM | POA: Diagnosis not present

## 2022-09-26 DIAGNOSIS — L700 Acne vulgaris: Secondary | ICD-10-CM | POA: Diagnosis not present

## 2022-09-26 DIAGNOSIS — L738 Other specified follicular disorders: Secondary | ICD-10-CM | POA: Diagnosis not present

## 2022-10-07 DIAGNOSIS — H5213 Myopia, bilateral: Secondary | ICD-10-CM | POA: Diagnosis not present

## 2022-11-14 DIAGNOSIS — L578 Other skin changes due to chronic exposure to nonionizing radiation: Secondary | ICD-10-CM | POA: Diagnosis not present

## 2022-11-14 DIAGNOSIS — D1801 Hemangioma of skin and subcutaneous tissue: Secondary | ICD-10-CM | POA: Diagnosis not present

## 2022-11-14 DIAGNOSIS — L821 Other seborrheic keratosis: Secondary | ICD-10-CM | POA: Diagnosis not present

## 2023-01-09 DIAGNOSIS — L7211 Pilar cyst: Secondary | ICD-10-CM | POA: Diagnosis not present

## 2023-02-12 DIAGNOSIS — F411 Generalized anxiety disorder: Secondary | ICD-10-CM | POA: Diagnosis not present

## 2023-02-26 DIAGNOSIS — F411 Generalized anxiety disorder: Secondary | ICD-10-CM | POA: Diagnosis not present

## 2023-03-05 DIAGNOSIS — F411 Generalized anxiety disorder: Secondary | ICD-10-CM | POA: Diagnosis not present

## 2023-03-06 DIAGNOSIS — L7211 Pilar cyst: Secondary | ICD-10-CM | POA: Diagnosis not present

## 2023-03-12 DIAGNOSIS — F411 Generalized anxiety disorder: Secondary | ICD-10-CM | POA: Diagnosis not present

## 2023-04-04 DIAGNOSIS — R42 Dizziness and giddiness: Secondary | ICD-10-CM | POA: Diagnosis not present

## 2023-04-04 DIAGNOSIS — R7309 Other abnormal glucose: Secondary | ICD-10-CM | POA: Diagnosis not present

## 2023-04-04 DIAGNOSIS — Z0000 Encounter for general adult medical examination without abnormal findings: Secondary | ICD-10-CM | POA: Diagnosis not present

## 2023-04-04 DIAGNOSIS — R7303 Prediabetes: Secondary | ICD-10-CM | POA: Diagnosis not present

## 2023-04-04 DIAGNOSIS — Z13220 Encounter for screening for lipoid disorders: Secondary | ICD-10-CM | POA: Diagnosis not present

## 2023-04-04 DIAGNOSIS — K5909 Other constipation: Secondary | ICD-10-CM | POA: Diagnosis not present

## 2023-04-04 DIAGNOSIS — E88819 Insulin resistance, unspecified: Secondary | ICD-10-CM | POA: Diagnosis not present

## 2023-04-09 DIAGNOSIS — F411 Generalized anxiety disorder: Secondary | ICD-10-CM | POA: Diagnosis not present

## 2023-04-16 DIAGNOSIS — F411 Generalized anxiety disorder: Secondary | ICD-10-CM | POA: Diagnosis not present

## 2024-01-29 ENCOUNTER — Other Ambulatory Visit: Payer: Self-pay | Admitting: Family Medicine

## 2024-01-29 DIAGNOSIS — E162 Hypoglycemia, unspecified: Secondary | ICD-10-CM
